# Patient Record
Sex: Female | Born: 1985 | Race: White | Hispanic: No | Marital: Married | Smoking: Never smoker
Health system: Southern US, Community
[De-identification: ages and names within clinical notes are randomized; demographics above are authoritative.]

## PROBLEM LIST (undated history)

## (undated) DIAGNOSIS — R519 Headache, unspecified: Secondary | ICD-10-CM

## (undated) DIAGNOSIS — R51 Headache: Secondary | ICD-10-CM

## (undated) DIAGNOSIS — K529 Noninfective gastroenteritis and colitis, unspecified: Secondary | ICD-10-CM

## (undated) HISTORY — PX: TUBAL LIGATION: SHX77

---

## 2015-04-12 ENCOUNTER — Encounter: Payer: Self-pay | Admitting: Emergency Medicine

## 2015-04-12 ENCOUNTER — Emergency Department
Admission: EM | Admit: 2015-04-12 | Discharge: 2015-04-12 | Disposition: A | Discharge status: Home / Self Care | Payer: Medicaid Other | Attending: Emergency Medicine | Admitting: Emergency Medicine

## 2015-04-12 DIAGNOSIS — N764 Abscess of vulva: Secondary | ICD-10-CM | POA: Diagnosis present

## 2015-04-12 DIAGNOSIS — N750 Cyst of Bartholin's gland: Secondary | ICD-10-CM | POA: Diagnosis not present

## 2015-04-12 MED ORDER — ACETAMINOPHEN-CODEINE #3 300-30 MG PO TABS: 1.0000 | ORAL_TABLET | Freq: Four times a day (QID) | ORAL | Status: DC | PRN

## 2015-04-12 MED ORDER — LIDOCAINE HCL (PF) 1 % IJ SOLN
INTRAMUSCULAR | Status: AC
  Administered 2015-04-12: 18:00:00
  Filled 2015-04-12: qty 5

## 2015-04-12 MED ORDER — CLINDAMYCIN HCL 300 MG PO CAPS: 300.0000 mg | ORAL_CAPSULE | Freq: Four times a day (QID) | ORAL | Status: DC

## 2015-04-12 NOTE — Discharge Instructions (Signed)
Bartholin's Cyst or Abscess °Bartholin's glands are small glands located within the folds of skin (labia) along the sides of the lower opening of the vagina (birth canal). A cyst may develop when the duct of the gland becomes blocked. When this happens, fluid that accumulates within the cyst can become infected. This is known as an abscess. The Bartholin gland produces a mucous fluid to lubricate the outside of the vagina during sexual intercourse. °SYMPTOMS  °· Patients with a small cyst may not have any symptoms. °· Mild discomfort to severe pain depending on the size of the cyst and if it is infected (abscess). °· Pain, redness, and swelling around the lower opening of the vagina. °· Painful intercourse. °· Pressure in the perineal area. °· Swelling of the lips of the vagina (labia). °· The cyst or abscess can be on one side or both sides of the vagina. °DIAGNOSIS  °· A large swelling is seen in the lower vagina area by your caregiver. °· Painful to touch. °· Redness and pain, if it is an abscess. °TREATMENT  °· Sometimes the cyst will go away on its own. °· Apply warm wet compresses to the area or take hot sitz baths several times a day. °· An incision to drain the cyst or abscess with local anesthesia. °· Culture the pus, if it is an abscess. °· Antibiotic treatment, if it is an abscess. °· Cut open the gland and suture the edges to make the opening of the gland bigger (marsupialization). °· Remove the whole gland if the cyst or abscess returns. °PREVENTION  °· Practice good hygiene. °· Clean the vaginal area with a mild soap and soft cloth when bathing. °· Do not rub hard in the vaginal area when bathing. °· Protect the crotch area with a padded cushion if you take long bike rides or ride horses. °· Be sure you are well lubricated when you have sexual intercourse. °HOME CARE INSTRUCTIONS  °· If your cyst or abscess was opened, a small piece of gauze, or a drain, may have been placed in the wound to allow  drainage. Do not remove this gauze or drain unless directed by your caregiver. °· Wear feminine pads, not tampons, as needed for any drainage or bleeding. °· If antibiotics were prescribed, take them exactly as directed. Finish the entire course. °· Only take over-the-counter or prescription medicines for pain, discomfort, or fever as directed by your caregiver. °SEEK IMMEDIATE MEDICAL CARE IF:  °· You have an increase in pain, redness, swelling, or drainage. °· You have bleeding from the wound which results in the use of more than the number of pads suggested by your caregiver in 24 hours. °· You have chills. °· You have a fever. °· You develop any new problems (symptoms) or aggravation of your existing condition. °MAKE SURE YOU:  °· Understand these instructions. °· Will watch your condition. °· Will get help right away if you are not doing well or get worse. °Document Released: 10/10/2005 Document Revised: 01/02/2012 Document Reviewed: 05/28/2008 °[employer]® Patient Information ©2015 [employer]. This information is not intended to replace advice given to you by your health care provider. Make sure you discuss any questions you have with your health care provider. ° ° °Incision and Drainage °Incision and drainage is a procedure in which a sac-like structure (cystic structure) is opened and drained. The area to be drained usually contains material such as pus, fluid, or blood.  °LET YOUR CAREGIVER KNOW ABOUT:  °· Allergies to   Medicines taken, including vitamins, herbs, eyedrops, over-the-counter medicines, and creams.  Use of steroids (by mouth or creams).  Previous problems with anesthetics or numbing medicines.  History of bleeding problems or blood clots.  Previous surgery.  Other health problems, including diabetes and kidney problems.  Possibility of pregnancy, if this applies. RISKS AND COMPLICATIONS  Pain.  Bleeding.  Scarring.  Infection. BEFORE THE PROCEDURE  You may  need to have an ultrasound or other imaging tests to see how large or deep your cystic structure is. Blood tests may also be used to determine if you have an infection or how severe the infection is. You may need to have a tetanus shot. PROCEDURE  The affected area is cleaned with a cleaning fluid. The cyst area will then be numbed with a medicine (local anesthetic). A small incision will be made in the cystic structure. A syringe or catheter may be used to drain the contents of the cystic structure, or the contents may be squeezed out. The area will then be flushed with a cleansing solution. After cleansing the area, it is often gently packed with a gauze or another wound dressing. Once it is packed, it will be covered with gauze and tape or some other type of wound dressing. AFTER THE PROCEDURE   Often, you will be allowed to go home right after the procedure.  You may be given antibiotic medicine to prevent or heal an infection.  If the area was packed with gauze or some other wound dressing, you will likely need to come back in 1 to 2 days to get it removed.  The area should heal in about 14 days. Document Released: 04/05/2001 Document Revised: 04/10/2012 Document Reviewed: 12/05/2011 [facility] Patient Information 2015 [place], [place]. This information is not intended to replace advice given to you by your health care provider. Make sure you discuss any questions you have with your health care provider.  Keep the area clean, dry, and covered. Use epsom salt (magnesium chloride) in your tub soaks to reduce swelling.  Take the antibiotic as directed until completley gone.  Follow-up your provider or the local health department for follow-up care.

## 2015-04-12 NOTE — ED Provider Notes (Signed)
[facility] Emergency Department Provider Note ?____________________________________________ ? Time seen: 1635 ? I have reviewed the triage vital signs and the nursing notes. ________ HISTORY ? Chief Complaint Abscess  HPI  Ariel Rodgers is a 29 y.o. female reports to the ED for evaluation management of a vulvar abscess with onset 2 days prior to arrival. She describes irritation to the vulvar area after an episode of intercourse. It began a tender spot to the inner labia and after a warm bath last night, the area began to spontaneously draining purulent discharge. She gives a history of a similar incident about 5 years ago, which required local incision and drainage. She denies fever, chills, sweats, nausea or dizziness.    History reviewed. No pertinent past medical history.  There are no active problems to display for this patient. ? Past Surgical History  Procedure Laterality Date  . Tubal ligation    ? Current Outpatient Rx  Name  Route  Sig  Dispense  Refill  . acetaminophen-codeine (TYLENOL #3) 300-30 MG per tablet   Oral   Take 1 tablet by mouth every 6 (six) hours as needed for moderate pain.   10 tablet   0   . clindamycin (CLEOCIN) 300 MG capsule   Oral   Take 1 capsule (300 mg total) by mouth 4 (four) times daily.   40 capsule   0   ? Allergies Review of patient's allergies indicates no known allergies. ? No family history on file. ? Social History History  Substance Use Topics  . Smoking status: Never Smoker   . Smokeless tobacco: Not on file  . Alcohol Use: Yes   Review of Systems Constitutional: Negative for fever. HEENT:  Normocephalic/atraumatic. Negative for visual/hearingchanges, sore throat, or nasal congestion. Cardiovascular: Negative for chest pain. Respiratory: Negative for shortness of breath. Musculoskeletal: Negative for back pain. Skin: Tender, draining abscess to the labia.  Neurological: Negative for  headaches, focal weakness or numbness. Hematological/Lymphatic:Negative for enlarged lymph nodes  10-point ROS otherwise negative. ____________________________________________  PHYSICAL EXAM:  VITAL SIGNS: ED Triage Vitals  Enc Vitals Group     BP 04/12/15 1556 116/81 mmHg     Pulse Rate 04/12/15 1556 80     Resp --      Temp 04/12/15 1556 99 F (37.2 C)     Temp Source 04/12/15 1556 Oral     SpO2 04/12/15 1556 100 %     Weight 04/12/15 1551 145 lb (65.772 kg)     Height 04/12/15 1551  (1.626 m)     Head Cir --      Peak Flow --      Pain Score 04/12/15 1551 7     Pain Loc --      Pain Edu? --      Excl. in GC? --    Constitutional: Alert and oriented. Well appearing and in no distress. HEENT: Normocephalic and atraumatic.Conjunctivae are normal. PERRL. Normal extraocular movements. Mucous membranes are moist. Hematological/Lymphatic/Immunilogical: No cervical lymphadenopathy. Cardiovascular: Normal rate, regular rhythm.No murmurs, rubs, or gallops.  Respiratory: Normal respiratory effort.  Genitourinary: Left bartholin gland cyst & abscess with spontaneous purulent drainage from the gland duct.  Musculoskeletal: Nontender with normal range of motion in all extremities. No joint effusions.  No lower extremity tenderness nor edema. Neurologic:  Normal speech and language. No gross focal neurologic deficits are appreciated.  Skin:  No rash noted. Psychiatric: Mood and affect are normal. Speech and behavior are normal. Patient exhibits appropriate  insight and judgment. _____________ PROCEDURES  INCISION AND DRAINAGE Performed by: Lissa Hoard Consent: Verbal consent obtained. Risks and benefits: risks, benefits and alternatives were discussed Type: abscess  Body area: vulva  Anesthesia: local infiltration  Incision was made with a scalpel.  Local anesthetic: lidocaine 1% w/o epinephrine  Anesthetic total: 3 ml  Complexity: simple Blunt dissection  to break up loculations  Drainage: purulent  Drainage amount: 3 ml  Patient tolerance: Patient tolerated the procedure well with no immediate complications. ______________________________________________________ INITIAL IMPRESSION / ASSESSMENT AND PLAN / ED COURSE ? Simple, spontaneously draining vulvar absces s/p incision & drainage. Referral to [facility] Department for follow-up management.  Return as needed for wound check. Warm compresses and sitz bath for cyst care. Prescription Clindamycin and Tylenol #3 for pain relief. ____________________________________________ FINAL CLINICAL IMPRESSION(S) / ED DIAGNOSES?  Final diagnoses:  Vulvar abscess  Bartholin's gland cyst      Lissa Hoard, PA-C 04/12/15 2303  Emily Filbert, MD 04/12/15 2308

## 2015-04-12 NOTE — ED Notes (Signed)
Patient presents to the ED with an abscess to her perineal area.  Patient states abscess is draining puss.  Patient ambulatory to triage.  No obvious distress at this time.

## 2017-08-07 ENCOUNTER — Emergency Department: Payer: Medicaid Other

## 2017-08-07 ENCOUNTER — Encounter: Payer: Self-pay | Admitting: *Deleted

## 2017-08-07 ENCOUNTER — Emergency Department
Admission: EM | Admit: 2017-08-07 | Discharge: 2017-08-07 | Disposition: A | Discharge status: Home / Self Care | Payer: Medicaid Other | Attending: Emergency Medicine | Admitting: Emergency Medicine

## 2017-08-07 DIAGNOSIS — R1030 Lower abdominal pain, unspecified: Secondary | ICD-10-CM | POA: Diagnosis present

## 2017-08-07 DIAGNOSIS — K529 Noninfective gastroenteritis and colitis, unspecified: Secondary | ICD-10-CM | POA: Insufficient documentation

## 2017-08-07 HISTORY — DX: Noninfective gastroenteritis and colitis, unspecified: K52.9

## 2017-08-07 LAB — URINALYSIS, COMPLETE (UACMP) WITH MICROSCOPIC
BILIRUBIN URINE: NEGATIVE
Bacteria, UA: NONE SEEN
Glucose, UA: NEGATIVE mg/dL
Ketones, ur: 5 mg/dL — AB
LEUKOCYTES UA: NEGATIVE
Nitrite: NEGATIVE
Protein, ur: NEGATIVE mg/dL
SPECIFIC GRAVITY, URINE: 1.028 (ref 1.005–1.030)
pH: 5 (ref 5.0–8.0)

## 2017-08-07 LAB — COMPREHENSIVE METABOLIC PANEL
ALBUMIN: 4.8 g/dL (ref 3.5–5.0)
ALT: 17 U/L (ref 14–54)
ANION GAP: 7 (ref 5–15)
AST: 20 U/L (ref 15–41)
Alkaline Phosphatase: 66 U/L (ref 38–126)
BUN: 13 mg/dL (ref 6–20)
CO2: 27 mmol/L (ref 22–32)
Calcium: 9.3 mg/dL (ref 8.9–10.3)
Chloride: 105 mmol/L (ref 101–111)
Creatinine, Ser: 0.65 mg/dL (ref 0.44–1.00)
GFR calc non Af Amer: >60 mL/min (ref >60)
GLUCOSE: 105 mg/dL — AB (ref 65–99)
Potassium: 3.9 mmol/L (ref 3.5–5.1)
Sodium: 139 mmol/L (ref 135–145)
Total Bilirubin: 0.6 mg/dL (ref 0.3–1.2)
Total Protein: 8 g/dL (ref 6.5–8.1)

## 2017-08-07 LAB — CBC
HCT: 42.8 % (ref 35.0–47.0)
Hemoglobin: 14.4 g/dL (ref 12.0–16.0)
MCH: 29.3 pg (ref 26.0–34.0)
MCHC: 33.5 g/dL (ref 32.0–36.0)
MCV: 87.3 fL (ref 80.0–100.0)
Platelets: 239 10*3/uL (ref 150–440)
RBC: 4.91 MIL/uL (ref 3.80–5.20)
RDW: 13.2 % (ref 11.5–14.5)
WBC: 12.2 10*3/uL — ABNORMAL HIGH (ref 3.6–11.0)

## 2017-08-07 LAB — LIPASE, BLOOD: LIPASE: 22 U/L (ref 11–51)

## 2017-08-07 LAB — PREGNANCY, URINE: Preg Test, Ur: NEGATIVE

## 2017-08-07 MED ORDER — CIPROFLOXACIN HCL 500 MG PO TABS: 500.0000 mg | ORAL_TABLET | Freq: Two times a day (BID) | ORAL | 0 refills | Status: AC

## 2017-08-07 MED ORDER — METRONIDAZOLE 500 MG PO TABS: 500.0000 mg | ORAL_TABLET | Freq: Three times a day (TID) | ORAL | 0 refills | Status: AC

## 2017-08-07 MED ORDER — IOPAMIDOL (ISOVUE-300) INJECTION 61%
15.0000 mL | INTRAVENOUS | Status: AC
  Administered 2017-08-07: 15 mL via ORAL
  Filled 2017-08-07 (×2): qty 15

## 2017-08-07 MED ORDER — METRONIDAZOLE 500 MG PO TABS
500.0000 mg | ORAL_TABLET | Freq: Once | ORAL | Status: AC
  Administered 2017-08-07: 500 mg via ORAL
  Filled 2017-08-07: qty 1

## 2017-08-07 MED ORDER — CIPROFLOXACIN HCL 500 MG PO TABS
500.0000 mg | ORAL_TABLET | Freq: Once | ORAL | Status: AC
  Administered 2017-08-07: 500 mg via ORAL
  Filled 2017-08-07: qty 1

## 2017-08-07 MED ORDER — IOPAMIDOL (ISOVUE-300) INJECTION 61%
100.0000 mL | Freq: Once | INTRAVENOUS | Status: AC | PRN
  Administered 2017-08-07: 100 mL via INTRAVENOUS
  Filled 2017-08-07: qty 100

## 2017-08-07 MED ORDER — SODIUM CHLORIDE 0.9 % IV BOLUS (SEPSIS)
1000.0000 mL | Freq: Once | INTRAVENOUS | Status: AC
  Administered 2017-08-07: 1000 mL via INTRAVENOUS

## 2017-08-07 NOTE — ED Triage Notes (Addendum)
Pt to ED reporting abd pain that began this morning. ,PT reports constipation and nausea. No fever reported at home. No changes in urine. Pt has hx of colitis. Pt also reports being late on her period but also has a hx of tubal ligation. Pt reports pain increases when she is attempting to have a BM.

## 2017-08-07 NOTE — ED Notes (Signed)
RN called lab to update on urine preg add-on.

## 2017-08-07 NOTE — ED Notes (Signed)
Pt discharged home after verbalizing understanding of discharge instructions; nad noted. 

## 2017-08-07 NOTE — ED Notes (Signed)
PT CAN NOT HAVE PAIN MEDICATION! PER PT REQUEST

## 2017-08-07 NOTE — ED Provider Notes (Signed)
[facility] Emergency Department Provider Note  ____________________________________________   First MD Initiated Contact with Patient 08/07/17 1402     (approximate)  I have reviewed the triage vital signs and the nursing notes.   HISTORY  Chief Complaint Abdominal Pain    HPI Ariel Rodgers is a 31 y.o. female history of C. difficile colitis years in the past was presenting to the emergency department with cramping and sharp abdominal pain across her lower abdomen. She says that she had not had a bowel movement in 2 days and has had 2 bowel movements this morning after taking a laxative. She says that her pain was severe prior to arrival and after having 2 bowel movements is now minimal but still aching to the lower abdomen. She denies any radiation of the back. Denies any nausea or vomiting. Denies any burning with urination, vaginal bleeding or discharge. Says that she has recently changed her diet and is eating more sallowness as well as fiber products but has not been drinking as much water as she thinks that she should. Patient has a history of a tubal ligation. Also says that she is late on her period.   Past Medical History:  Diagnosis Date  . Colitis     There are no active problems to display for this patient.   Past Surgical History:  Procedure Laterality Date  . TUBAL LIGATION      Prior to Admission medications   Medication Sig Start Date End Date Taking? Authorizing Provider  acetaminophen-codeine (TYLENOL #3) 300-30 MG per tablet Take 1 tablet by mouth every 6 (six) hours as needed for moderate pain. 04/12/15   Menshew, Charlesetta Ivory, PA-C  clindamycin (CLEOCIN) 300 MG capsule Take 1 capsule (300 mg total) by mouth 4 (four) times daily. 04/12/15   Menshew, Charlesetta Ivory, PA-C    Allergies Patient has no known allergies.  History reviewed. No pertinent family history.  Social History Social History  Substance Use Topics  .  Smoking status: Never Smoker  . Smokeless tobacco: Never Used  . Alcohol use Yes    Review of Systems  Constitutional: No fever/chills Eyes: No visual changes. ENT: No sore throat. Cardiovascular: Denies chest pain. Respiratory: Denies shortness of breath. Gastrointestinal:  No nausea, no vomiting.  No diarrhea.   Genitourinary: Negative for dysuria. Musculoskeletal: Negative for back pain. Skin: Negative for rash. Neurological: Negative for headaches, focal weakness or numbness.   ____________________________________________   PHYSICAL EXAM:  VITAL SIGNS: ED Triage Vitals  Enc Vitals Group     BP 08/07/17 1201 123/68     Pulse Rate 08/07/17 1201 95     Resp 08/07/17 1201 16     Temp 08/07/17 1201 99 F (37.2 C)     Temp Source 08/07/17 1201 Oral     SpO2 08/07/17 1201 100 %     Weight 08/07/17 1201 155 lb (70.3 kg)     Height 08/07/17 1201  (1.626 m)     Head Circumference --      Peak Flow --      Pain Score 08/07/17 1205 3     Pain Loc --      Pain Edu? --      Excl. in GC? --     Constitutional: Alert and oriented. Well appearing and in no acute distress. Eyes: Conjunctivae are normal.  Head: Atraumatic. Nose: No congestion/rhinnorhea. Mouth/Throat: Mucous membranes are moist.  Neck: No stridor.   Cardiovascular: Normal rate,  regular rhythm. Grossly normal heart sounds.  Respiratory: Normal respiratory effort.  No retractions. Lungs CTAB. Gastrointestinal: Soft with minimal tenderness across the lower abdomen without any rebound or guarding. No upper abdominal tenderness to palpation. no distention. No CVA tenderness. Musculoskeletal: No lower extremity tenderness nor edema.  No joint effusions. Neurologic:  Normal speech and language. No gross focal neurologic deficits are appreciated. Skin:  Skin is warm, dry and intact. No rash noted. Psychiatric: Mood and affect are normal. Speech and behavior are  normal.  ____________________________________________   LABS (all labs ordered are listed, but only abnormal results are displayed)  Labs Reviewed  COMPREHENSIVE METABOLIC PANEL - Abnormal; Notable for the following:       Result Value   Glucose, Bld 105 (*)    All other components within normal limits  CBC - Abnormal; Notable for the following:    WBC 12.2 (*)    All other components within normal limits  URINALYSIS, COMPLETE (UACMP) WITH MICROSCOPIC - Abnormal; Notable for the following:    Color, Urine AMBER (*)    APPearance CLEAR (*)    Hgb urine dipstick MODERATE (*)    Ketones, ur 5 (*)    Squamous Epithelial / LPF 0-5 (*)    All other components within normal limits  LIPASE, BLOOD  PREGNANCY, URINE   ____________________________________________  EKG   ____________________________________________  RADIOLOGY  mild areas of colon wall thickening/colitis which can be seen with infectious or inflammatory bowel disease. ____________________________________________   PROCEDURES  Procedure(s) performed:   Procedures  Critical Care performed:   ____________________________________________   INITIAL IMPRESSION / ASSESSMENT AND PLAN / ED COURSE  Pertinent labs & imaging results that were available during my care of the patient were reviewed by me and considered in my medical decision making (see chart for details).  Differential diagnosis includes, but is not limited to, ovarian cyst, ovarian torsion, acute appendicitis, diverticulitis, urinary tract infection/pyelonephritis, endometriosis, bowel obstruction, colitis, renal colic, gastroenteritis, hernia, pregnancy related pain including ectopic pregnancy, etc.  As part of my medical decision making, I reviewed the following data within the electronic MEDICAL RECORD NUMBER Old chart reviewed  ----------------------------------------- 5:25 PM on 08/07/2017 -----------------------------------------  Patient without  any further complaints. CT showing what appears to be colitis. The patient will be discharged than about it. I recommended gastrology follow-up as this is the second episode of colitis that she has had in her lifetime. Patient will likely require colonoscopy for further investigation. She is understanding of the plan and willing to comply. Will be discharged home at this time.      ____________________________________________   FINAL CLINICAL IMPRESSION(S) / ED DIAGNOSES  colitis    NEW MEDICATIONS STARTED DURING THIS VISIT:  New Prescriptions   No medications on file     Note:  This document was prepared using Dragon voice recognition software and may include unintentional dictation errors.     Myrna Blazer, MD 08/07/17 1725

## 2017-08-28 ENCOUNTER — Ambulatory Visit: Payer: Medicaid Other | Admitting: Gastroenterology

## 2017-08-28 ENCOUNTER — Encounter: Payer: Self-pay | Admitting: Gastroenterology

## 2017-08-28 ENCOUNTER — Other Ambulatory Visit: Payer: Self-pay

## 2017-08-28 VITALS — BP 121/73 | HR 64 | Temp 98.2°F | Ht 64.0 in | Wt 156.4 lb

## 2017-08-28 DIAGNOSIS — K5909 Other constipation: Secondary | ICD-10-CM

## 2017-08-28 DIAGNOSIS — R935 Abnormal findings on diagnostic imaging of other abdominal regions, including retroperitoneum: Secondary | ICD-10-CM

## 2017-08-28 NOTE — Progress Notes (Signed)
Ariel Darby, MD [address]  [place]  [place], [place] [address]  Main: [phone]  Fax: [phone]    Gastroenterology Consultation  Referring Provider:    ER Primary Care Physician: None Primary Gastroenterologist:  Dr. Cephas Rodgers Reason for Consultation:     Chronic constipation        HPI:   Ariel Rodgers is a 31 y.o. female referred from ER  for consultation & management of chronic constipation and abnormal CT. She had history of Clostridium difficile infection several years ago. Has been suffering from constipation for several years now, associated with significant straining, she said she cries whenever she has a BM. She denies any rectal bleeding but reports feeling bloated. She went to ER on 08/07/2017 due to worsening abdominal pain and constipation. She had a CT at that time which revealed nonspecific thickening of the colon. She was discharged home on 10 days course of Cipro and Flagyl which has resulted in diarrhea associated with leakage. She is done with antibiotics about 3 days ago, and her stools are becoming more formed. She hasn't taken any stool softeners. She reports perianal rash and burning whenever she wipes due to loose bowel movements. She denies any other GI symptoms. She is on Suboxone.  NSAIDs: None  Antiplts/Anticoagulants/Anti thrombotics: None  GI Procedures: None She denies having any GI surgeries or other abdominal surgeries other than tubal ligation She denies having any known GI malignancies in the family  She works from home, has 3 children, oldest is 1 years old, married, lives with her husband  Past Medical History:  Diagnosis Date  . Colitis     Past Surgical History:  Procedure Laterality Date  . TUBAL LIGATION      Prior to Admission medications   Medication Sig Start Date End Date Taking? Authorizing Provider  acetaminophen-codeine (TYLENOL #3) 300-30 MG per tablet Take 1 tablet by mouth every 6 (six) hours as  needed for moderate pain. 04/12/15   Menshew, Dannielle Karvonen, PA-C  clindamycin (CLEOCIN) 300 MG capsule Take 1 capsule (300 mg total) by mouth 4 (four) times daily. 04/12/15   Menshew, Dannielle Karvonen, PA-C  SUBOXONE 8-2 MG FILM PLACE 1 FILM UNDER TONGURE 2 TIMES A DAY 08/11/17   [provider]    No family history on file.   Social History   Tobacco Use  . Smoking status: Never Smoker  . Smokeless tobacco: Never Used  Substance Use Topics  . Alcohol use: Yes  . Drug use: Not on file    Allergies as of 08/28/2017  . (No Known Allergies)    Review of Systems:    All systems reviewed and negative except where noted in HPI.   Physical Exam:  There were no vitals taken for this visit. No LMP recorded.  General:   Alert,  Well-developed, well-nourished, pleasant and cooperative in NAD Head:  Normocephalic and atraumatic. Eyes:  Sclera clear, no icterus.   Conjunctiva pink. Ears:  Normal auditory acuity. Nose:  No deformity, discharge, or lesions. Mouth:  No deformity or lesions,oropharynx pink & moist. Neck:  Supple; no masses or thyromegaly. Lungs:  Respirations even and unlabored.  Clear throughout to auscultation.   No wheezes, crackles, or rhonchi. No acute distress. Heart:  Regular rate and rhythm; no murmurs, clicks, rubs, or gallops. Abdomen:  Normal bowel sounds. Soft, non-tender and non-distended without masses, hepatosplenomegaly or hernias noted.  No guarding or rebound tenderness.   Rectal: Nor performed,  mild erythema in the perianal area but no skin breakdown Msk:  Symmetrical without gross deformities. Good, equal movement & strength bilaterally. Pulses:  Normal pulses noted. Extremities:  No clubbing or edema.  No cyanosis. Neurologic:  Alert and oriented x3;  grossly normal neurologically. Skin:  Intact without significant lesions or rashes. No jaundice. Lymph Nodes:  No significant cervical adenopathy. Psych:  Alert and cooperative. Normal mood and  affect.  Imaging Studies: Reviewed  Assessment and Plan:   Ariel Rodgers is a 31 y.o. female with remote history of Clostridium difficile infection reported by patient, chronic constipation with recent ER visit due to worsening of symptoms, CT A/P revealed nonspecific thickening of the colon. Currently, she is not suffering from constipation  - Check ESR, CRP, fecal calProtectin, TSH - Schedule colonoscopy  I have discussed alternative options, risks & benefits,  which include, but are not limited to, bleeding, infection, perforation,respiratory complication & drug reaction.  The patient agrees with this plan & written consent will be obtained.     Follow up in 3 months   Ariel Darby, MD

## 2017-08-30 ENCOUNTER — Other Ambulatory Visit: Payer: Self-pay

## 2017-08-30 DIAGNOSIS — K5909 Other constipation: Secondary | ICD-10-CM

## 2017-08-30 DIAGNOSIS — R935 Abnormal findings on diagnostic imaging of other abdominal regions, including retroperitoneum: Secondary | ICD-10-CM

## 2017-08-31 ENCOUNTER — Other Ambulatory Visit
Admission: RE | Admit: 2017-08-31 | Discharge: 2017-08-31 | Disposition: A | Discharge status: Home / Self Care | Payer: Medicaid Other | Source: Ambulatory Visit | Attending: Gastroenterology | Admitting: Gastroenterology

## 2017-08-31 DIAGNOSIS — R935 Abnormal findings on diagnostic imaging of other abdominal regions, including retroperitoneum: Secondary | ICD-10-CM | POA: Diagnosis not present

## 2017-08-31 LAB — TSH: TSH: 1.701 u[IU]/mL (ref 0.350–4.500)

## 2017-08-31 LAB — C-REACTIVE PROTEIN: CRP: <0.8 mg/dL (ref <1.0)

## 2017-08-31 LAB — SEDIMENTATION RATE: SED RATE: 14 mm/h (ref 0–20)

## 2017-09-01 ENCOUNTER — Other Ambulatory Visit
Admission: RE | Admit: 2017-09-01 | Discharge: 2017-09-01 | Disposition: A | Discharge status: Home / Self Care | Payer: Medicaid Other | Source: Ambulatory Visit | Attending: Gastroenterology | Admitting: Gastroenterology

## 2017-09-01 ENCOUNTER — Telehealth: Payer: Self-pay

## 2017-09-01 NOTE — Telephone Encounter (Signed)
Pt has been informed her blood work is normal.  She said she will drop her stool off today she just forgot.  Thanks [employer]le

## 2017-09-01 NOTE — Telephone Encounter (Signed)
-----   Message from Toney Reilohini Reddy Vanga, MD sent at 09/01/2017  1:19 PM EST ----- Notify pt of the normal blood work. She needs to drop off stool sample for fecal calprotectin test  -RV

## 2017-09-07 LAB — CALPROTECTIN, FECAL: Calprotectin, Fecal: 59 ug/g (ref 0–120)

## 2017-09-08 ENCOUNTER — Telehealth: Payer: Self-pay

## 2017-09-08 NOTE — Telephone Encounter (Signed)
-----   Message from Toney Reilohini Reddy Vanga, MD sent at 09/07/2017  5:12 PM EST ----- Notify pt of the result as normal  -RV

## 2017-09-08 NOTE — Telephone Encounter (Signed)
Pt has been informed labs are normal.  Thanks Marcelino DusterMichelle

## 2017-09-21 ENCOUNTER — Encounter: Admission: RE | Payer: Self-pay | Source: Ambulatory Visit

## 2017-09-21 ENCOUNTER — Other Ambulatory Visit: Payer: Self-pay

## 2017-09-21 ENCOUNTER — Ambulatory Visit: Admission: RE | Admit: 2017-09-21 | Payer: Medicaid Other | Source: Ambulatory Visit | Admitting: Gastroenterology

## 2017-09-21 DIAGNOSIS — R935 Abnormal findings on diagnostic imaging of other abdominal regions, including retroperitoneum: Secondary | ICD-10-CM

## 2017-09-21 SURGERY — COLONOSCOPY WITH PROPOFOL
Anesthesia: General

## 2017-09-25 ENCOUNTER — Encounter: Payer: Self-pay | Admitting: *Deleted

## 2017-09-25 ENCOUNTER — Other Ambulatory Visit: Payer: Self-pay

## 2017-09-26 ENCOUNTER — Encounter: Payer: Self-pay | Admitting: Anesthesiology

## 2017-09-26 NOTE — Discharge Instructions (Signed)
General Anesthesia, Adult, Care After °These instructions provide you with information about caring for yourself after your procedure. Your health care provider may also give you more specific instructions. Your treatment has been planned according to current medical practices, but problems sometimes occur. Call your health care provider if you have any problems or questions after your procedure. °What can I expect after the procedure? °After the procedure, it is common to have: °· Vomiting. °· A sore throat. °· Mental slowness. ° °It is common to feel: °· Nauseous. °· Cold or shivery. °· Sleepy. °· Tired. °· Sore or achy, even in parts of your body where you did not have surgery. ° °Follow these instructions at home: °For at least 24 hours after the procedure: °· Do not: °? Participate in activities where you could fall or become injured. °? Drive. °? Use heavy machinery. °? Drink alcohol. °? Take sleeping pills or medicines that cause drowsiness. °? Make important decisions or sign legal documents. °? Take care of children on your own. °· Rest. °Eating and drinking °· If you vomit, drink water, juice, or soup when you can drink without vomiting. °· Drink enough fluid to keep your urine clear or pale yellow. °· Make sure you have little or no nausea before eating solid foods. °· Follow the diet recommended by your health care provider. °General instructions °· Have a responsible adult stay with you until you are awake and alert. °· Return to your normal activities as told by your health care provider. Ask your health care provider what activities are safe for you. °· Take over-the-counter and prescription medicines only as told by your health care provider. °· If you smoke, do not smoke without supervision. °· Keep all follow-up visits as told by your health care provider. This is important. °Contact a health care provider if: °· You continue to have nausea or vomiting at home, and medicines are not helpful. °· You  cannot drink fluids or start eating again. °· You cannot urinate after 8-12 hours. °· You develop a skin rash. °· You have fever. °· You have increasing redness at the site of your procedure. °Get help right away if: °· You have difficulty breathing. °· You have chest pain. °· You have unexpected bleeding. °· You feel that you are having a life-threatening or urgent problem. °This information is not intended to replace advice given to you by your health care provider. Make sure you discuss any questions you have with your health care provider. °Document Released: 01/16/2001 Document Revised: 03/14/2016 Document Reviewed: 09/24/2015 °Elsevier Interactive Patient Education © 2018 [employer] ° °

## 2017-09-27 ENCOUNTER — Ambulatory Visit: Admission: RE | Admit: 2017-09-27 | Payer: Medicaid Other | Source: Ambulatory Visit | Admitting: Gastroenterology

## 2017-09-27 HISTORY — DX: Headache: R51

## 2017-09-27 HISTORY — DX: Headache, unspecified: R51.9

## 2017-09-27 SURGERY — COLONOSCOPY WITH PROPOFOL
Anesthesia: Choice

## 2019-06-08 IMAGING — CT CT ABD-PELV W/ CM
2 of 4 series · 16 of 46 positions shown, 18 images · IV contrast (APPLIED)
Comparison: None.

CLINICAL DATA: Constipation nausea and abdominal pain

EXAM:
CT ABDOMEN AND PELVIS WITH CONTRAST
TECHNIQUE: Multidetector CT imaging of the abdomen and pelvis was performed
using the standard protocol following bolus administration of
intravenous contrast.
CONTRAST:  100mL [id number]-LOO IOPAMIDOL (Y9VE4V-LOO) INJECTION 61%

[Series 2: routine abd/pel with · axial · 0.76mm/px · z∈[-479,-99]mm · 13 of 84 slices shown, 15 images]
[im 4/84  soft-tissue]
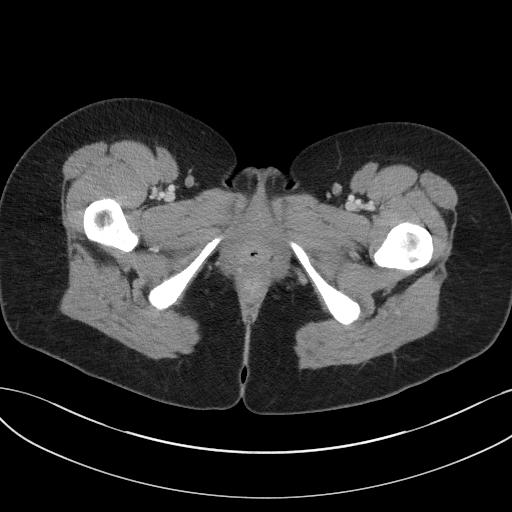
[im 4/84  bone]
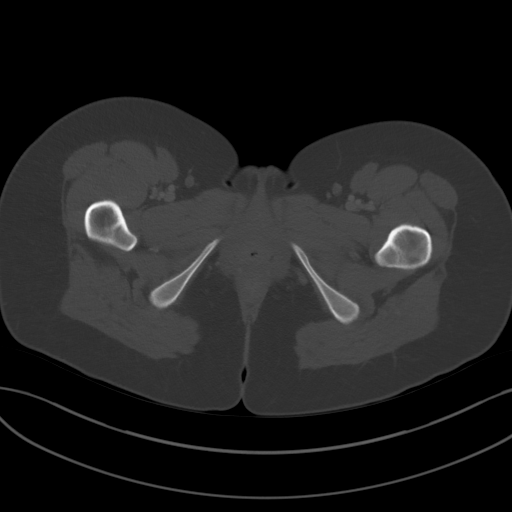
[im 10/84  soft-tissue]
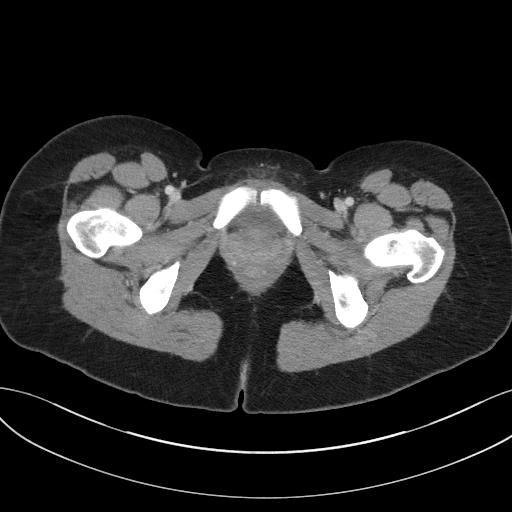
[im 17/84  soft-tissue]
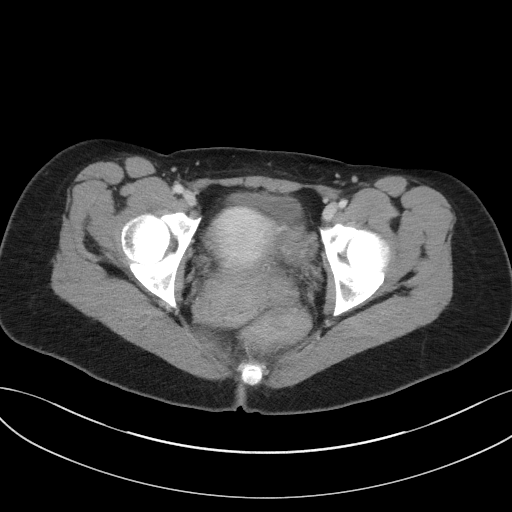
[im 24/84  soft-tissue]
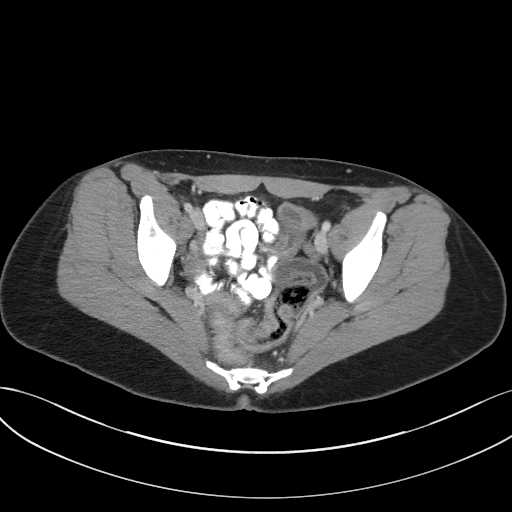
[im 30/84  soft-tissue]
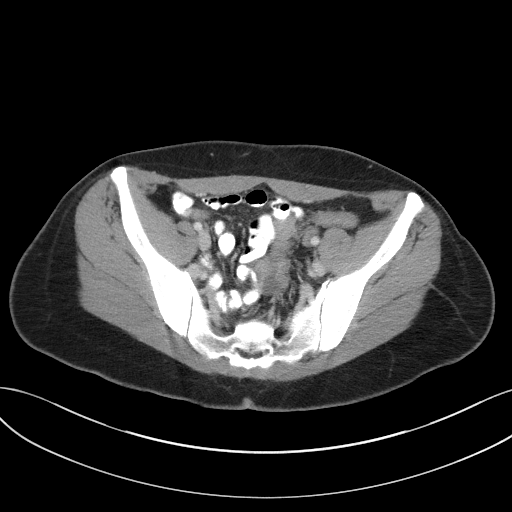
[im 37/84  soft-tissue]
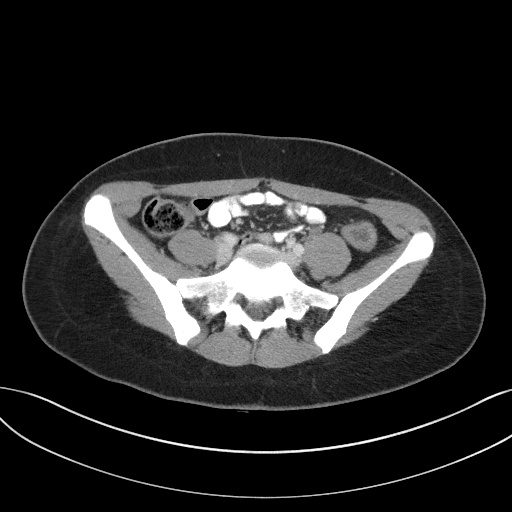
[im 44/84  soft-tissue]
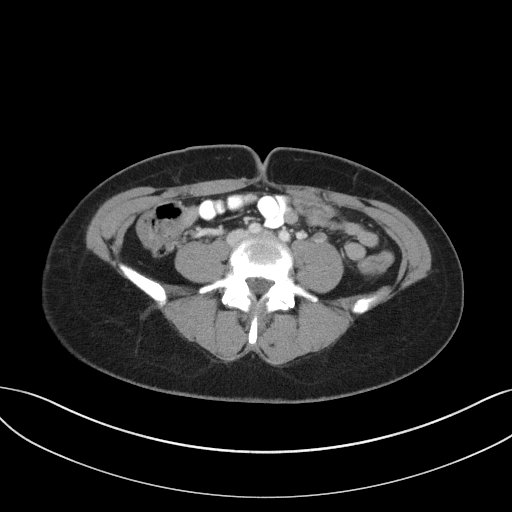
[im 47/84  soft-tissue]
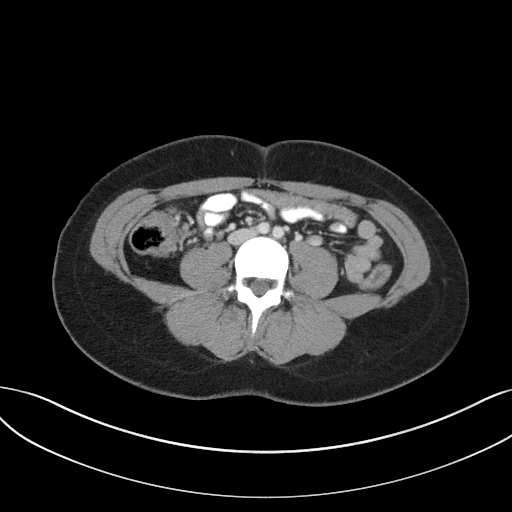
[im 54/84  soft-tissue]
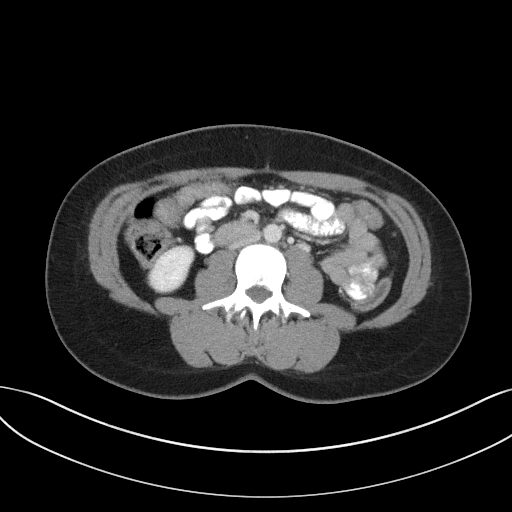
[im 54/84  bone]
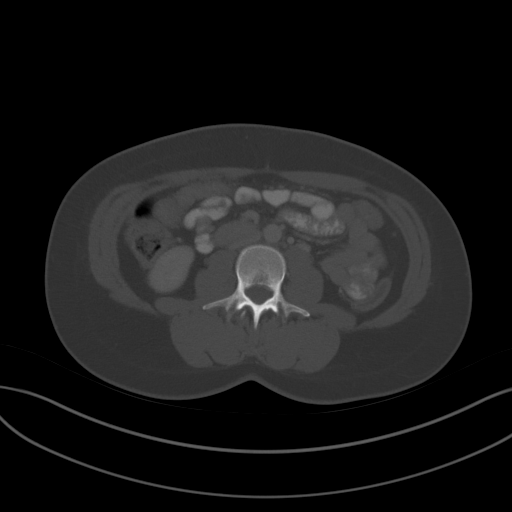
[im 60/84  soft-tissue]
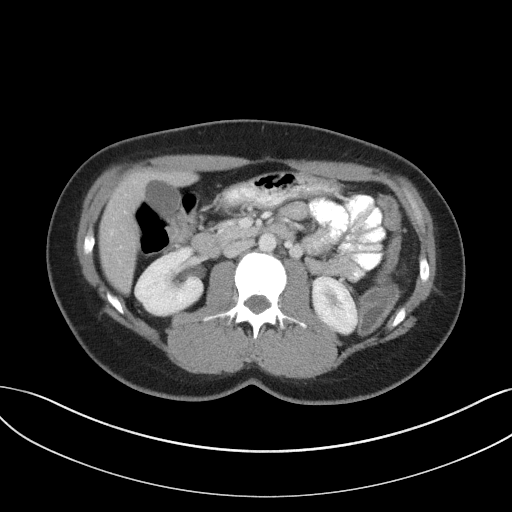
[im 67/84  soft-tissue]
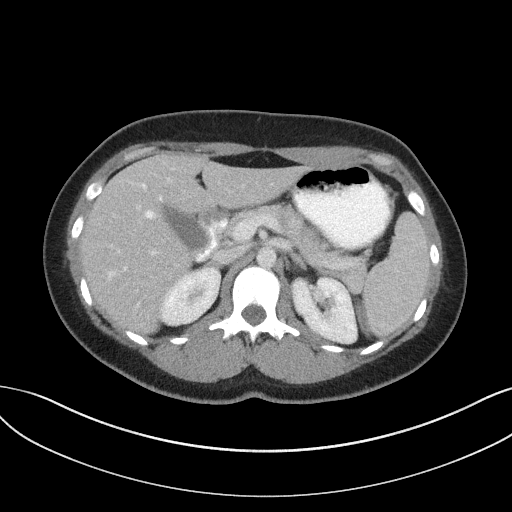
[im 74/84  soft-tissue]
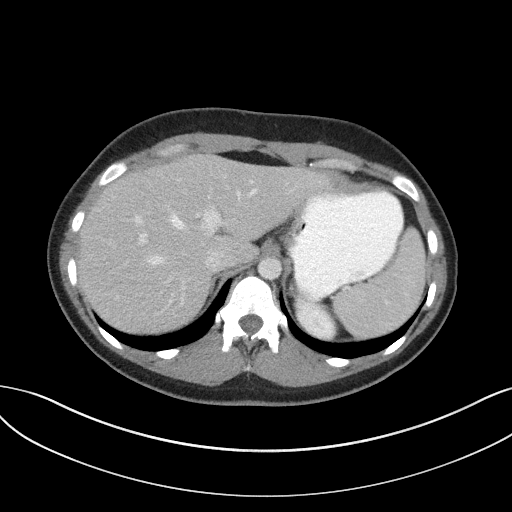
[im 80/84  soft-tissue]
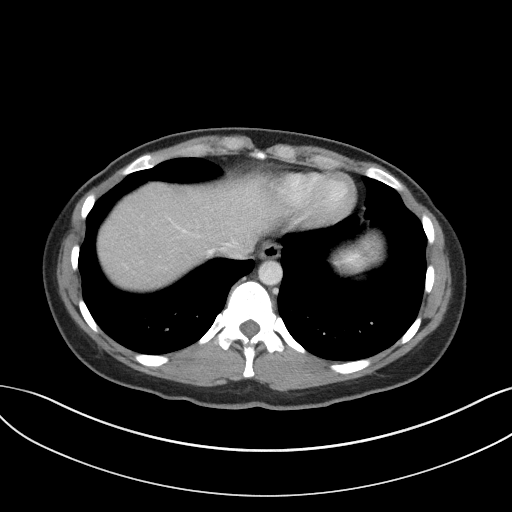

[Series 5: coronal st · coronal · 0.63mm/px · 3 of 75 slices shown]
[im 25/75  soft-tissue]
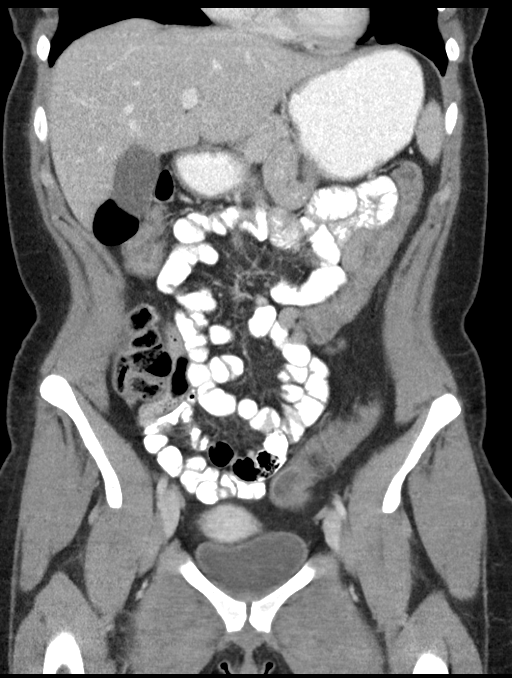
[im 33/75  soft-tissue]
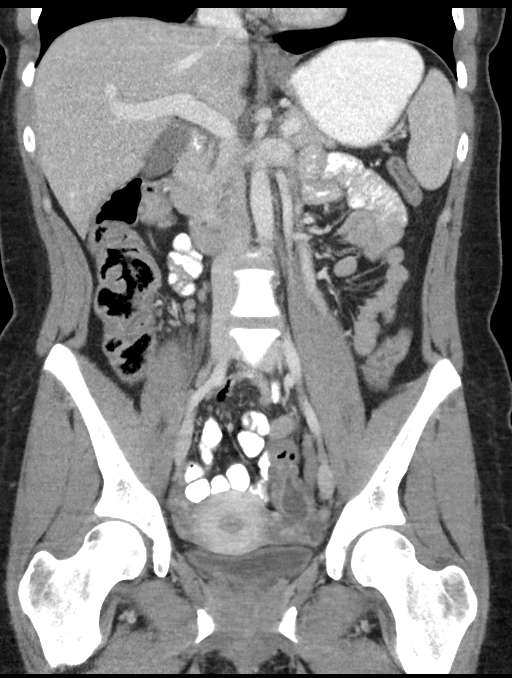
[im 42/75  soft-tissue]
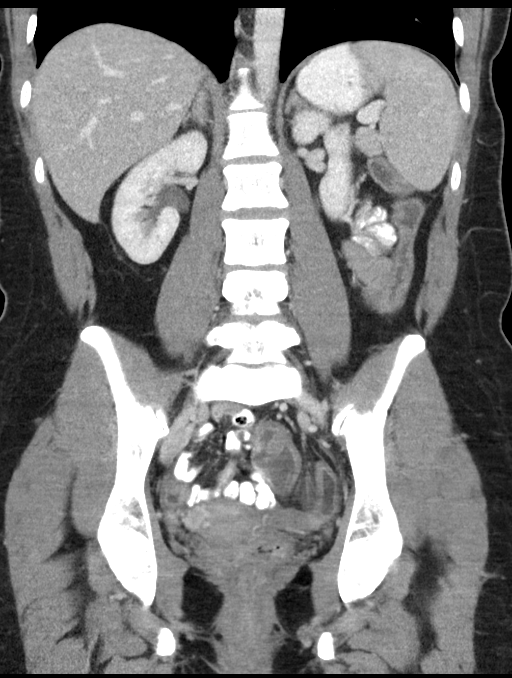

[16 of 46 positions shown; findings below may reference images not displayed]

FINDINGS: Lower chest: No acute abnormality.

Hepatobiliary: No focal liver abnormality is seen. No gallstones,
gallbladder wall thickening, or biliary dilatation.

Pancreas: Unremarkable. No pancreatic ductal dilatation or
surrounding inflammatory changes.

Spleen: Normal in size without focal abnormality.

Adrenals/Urinary Tract: Adrenal glands are unremarkable. Kidneys are
normal, without renal calculi, focal lesion, or hydronephrosis.
Bladder is unremarkable.

Stomach/Bowel: Stomach is nonenlarged. No dilated small bowel.
Suspected areas of mild colon wall thickening and mucosal
enhancement at the ascending colon, descending colon, and
rectosigmoid colon. Short but normal appearing appendix.

Vascular/Lymphatic: No significant vascular findings are present. No
enlarged abdominal or pelvic lymph nodes.

Reproductive: Uterus and bilateral adnexa are unremarkable.

Other: Small amount of free fluid in the pelvis. Negative for free
air

Musculoskeletal: No acute or significant osseous findings.
IMPRESSION: 1. Suspected mild areas of colon wall thickening/colitis as may be
seen with infectious or inflammatory bowel disease. Negative for
obstruction.
2. Small amount of free fluid in the pelvis.

## 2020-05-16 ENCOUNTER — Other Ambulatory Visit: Payer: Self-pay

## 2020-05-16 ENCOUNTER — Ambulatory Visit (INDEPENDENT_AMBULATORY_CARE_PROVIDER_SITE_OTHER): Payer: Medicaid Other

## 2020-05-16 ENCOUNTER — Ambulatory Visit (HOSPITAL_COMMUNITY)
Admission: EM | Admit: 2020-05-16 | Discharge: 2020-05-16 | Disposition: A | Discharge status: Home / Self Care | Payer: Medicaid Other | Attending: Emergency Medicine | Admitting: Emergency Medicine

## 2020-05-16 ENCOUNTER — Encounter (HOSPITAL_COMMUNITY): Payer: Self-pay | Admitting: Emergency Medicine

## 2020-05-16 DIAGNOSIS — S62630A Displaced fracture of distal phalanx of right index finger, initial encounter for closed fracture: Secondary | ICD-10-CM

## 2020-05-16 DIAGNOSIS — M7989 Other specified soft tissue disorders: Secondary | ICD-10-CM | POA: Diagnosis not present

## 2020-05-16 DIAGNOSIS — M79644 Pain in right finger(s): Secondary | ICD-10-CM | POA: Diagnosis not present

## 2020-05-16 MED ORDER — IBUPROFEN 800 MG PO TABS: 800.0000 mg | ORAL_TABLET | Freq: Once | ORAL | Status: DC

## 2020-05-16 MED ORDER — IBUPROFEN 800 MG PO TABS: 800.0000 mg | ORAL_TABLET | Freq: Three times a day (TID) | ORAL | 0 refills | Status: DC

## 2020-05-16 NOTE — Discharge Instructions (Signed)
Continue to wear splint Follow up with hand/ortho for further  treatment Use anti-inflammatories for pain/swelling. You may take up to 800 mg Ibuprofen every 8 hours with food. You may supplement Ibuprofen with Tylenol [phone] mg every 8 hours.  Ice

## 2020-05-16 NOTE — ED Triage Notes (Signed)
Pt c/o right index finger injury after bumping her hand on her husbands head. She states it swole up immediately and it is still crooked 3 weeks later. Pt has been keeping finger in a splint. Pt states finger is still painful and she is unable to bend tip of finger and it is numb.

## 2020-05-17 NOTE — ED Provider Notes (Signed)
[facility]    CSN: [id number] Arrival date & time: 05/16/20  1113      History   Chief Complaint Chief Complaint  Patient presents with  . Finger Injury    HPI Ariel Rodgers is a 34 y.o. female presenting today for evaluation of finger injury.  Patient reports approximately 3 weeks ago she was playing wrestling with her kids and husband, accidentally jammed her right index finger into her husband's head and has had pain and swelling around her DIP for the past 3 weeks.  She has been using an over-the-counter splint and buddy taping without relief of symptoms.  She reports continued difficulty bending at this joint as well as numbness from this joint and distally.  She has been taking some Tylenol and ibuprofen occasionally.  Reports that she cannot take narcotics, is on Suboxone.  HPI  Past Medical History:  Diagnosis Date  . Colitis   . Headache    rare    There are no problems to display for this patient.   Past Surgical History:  Procedure Laterality Date  . TUBAL LIGATION      OB History   No obstetric history on file.      Home Medications    Prior to Admission medications   Medication Sig Start Date End Date Taking? Authorizing Provider  ibuprofen (ADVIL) 800 MG tablet Take 1 tablet (800 mg total) by mouth 3 (three) times daily. 05/16/20   Lashanti Chambless C, PA-C  SUBOXONE 8-2 MG FILM PLACE 1 FILM UNDER TONGURE 2 TIMES A DAY 08/11/17   [provider]    Family History Family History  Family history unknown: Yes    Social History Social History   Tobacco Use  . Smoking status: Never Smoker  . Smokeless tobacco: Never Used  Vaping Use  . Vaping Use: Never used  Substance Use Topics  . Alcohol use: No  . Drug use: No     Allergies   Patient has no known allergies.   Review of Systems Review of Systems  Constitutional: Negative for fatigue and fever.  HENT: Negative for mouth sores.   Eyes: Negative for visual  disturbance.  Respiratory: Negative for shortness of breath.   Cardiovascular: Negative for chest pain.  Gastrointestinal: Negative for abdominal pain, nausea and vomiting.  Musculoskeletal: Positive for arthralgias. Negative for joint swelling.  Skin: Positive for color change. Negative for rash and wound.  Neurological: Negative for dizziness, weakness, light-headedness and headaches.     Physical Exam Triage Vital Signs ED Triage Vitals  Enc Vitals Group     BP 05/16/20 1323 123/71     Pulse Rate 05/16/20 1323 82     Resp 05/16/20 1323 14     Temp 05/16/20 1323 98.1 F (36.7 C)     Temp Source 05/16/20 1323 Oral     SpO2 05/16/20 1323 98 %     Weight --      Height --      Head Circumference --      Peak Flow --      Pain Score 05/16/20 1322 0     Pain Loc --      Pain Edu? --      Excl. in GC? --    No data found.  Updated Vital Signs BP 123/71 (BP Location: Right Arm)   Pulse 82   Temp 98.1 F (36.7 C) (Oral)   Resp 14   LMP 04/23/2020 (Approximate)   SpO2 98%  Visual Acuity Right Eye Distance:   Left Eye Distance:   Bilateral Distance:    Right Eye Near:   Left Eye Near:    Bilateral Near:     Physical Exam Vitals and nursing note reviewed.  Constitutional:      Appearance: She is well-developed.     Comments: No acute distress  HENT:     Head: Normocephalic and atraumatic.     Nose: Nose normal.  Eyes:     Conjunctiva/sclera: Conjunctivae normal.  Cardiovascular:     Rate and Rhythm: Normal rate.  Pulmonary:     Effort: Pulmonary effort is normal. No respiratory distress.  Abdominal:     General: There is no distension.  Musculoskeletal:        General: Normal range of motion.     Cervical back: Neck supple.     Comments: Right index finger with erythema and swelling about DIP, nontender to palpation throughout dorsum of right hand, proximal and middle phalanx of right index finger, tenderness to palpation about DIP.  Full active range of  motion at PIP  Skin:    General: Skin is warm and dry.  Neurological:     Mental Status: She is alert and oriented to person, place, and time.      UC Treatments / Results  Labs (all labs ordered are listed, but only abnormal results are displayed) Labs Reviewed - No data to display  EKG   Radiology DG Finger Index Right  Result Date: 05/16/2020 CLINICAL DATA:  Finger injury 3 weeks ago. Pain and swelling at the D IP joint. EXAM: RIGHT INDEX FINGER 2+V COMPARISON:  None. FINDINGS: There is an acute, mildly displaced intra-articular fracture involving the dorsal base of the distal phalanx, best seen on the lateral view. This demonstrates approximately 2 mm of posterior displacement. There is no dislocation. Soft tissue swelling is present at the DIP joint. IMPRESSION: Mildly displaced intra-articular fracture of the distal phalanx. This likely involves the extensor tendon insertion (mallet finger). Electronically Signed   By: Carey Bullocks M.D.   On: 05/16/2020 14:09    Procedures Procedures (including critical care time)  Medications Ordered in UC Medications - No data to display  Initial Impression / Assessment and Plan / UC Course  I have reviewed the triage vital signs and the nursing notes.  Pertinent labs & imaging results that were available during my care of the patient were reviewed by me and considered in my medical decision making (see chart for details).     X-ray with mildly displaced intra-articular fracture of distal phalanx.  Possible extensor tendon involvement.  Recommending to continue splint and follow-up with hand/orthopedics for further monitoring and management of fracture.  Tylenol and ibuprofen for pain.  Discussed strict return precautions. Patient verbalized understanding and is agreeable with plan.  Final Clinical Impressions(s) / UC Diagnoses   Final diagnoses:  Displaced fracture of distal phalanx of right index finger, initial encounter for  closed fracture     Discharge Instructions     Continue to wear splint Follow up with hand/ortho for further  treatment Use anti-inflammatories for pain/swelling. You may take up to 800 mg Ibuprofen every 8 hours with food. You may supplement Ibuprofen with Tylenol [phone] mg every 8 hours.  Ice   ED Prescriptions    Medication Sig Dispense Auth. Provider   ibuprofen (ADVIL) 800 MG tablet Take 1 tablet (800 mg total) by mouth 3 (three) times daily. 21 tablet Rozanne Heumann, [place] C, PA-C  PDMP not reviewed this encounter.   Lew Dawes, [place] 05/17/20 [phone]

## 2022-03-17 IMAGING — DX DG FINGER INDEX 2+V*R*
3 series · 3 of 3 positions shown · non-contrast
Comparison: None.

CLINICAL DATA: Finger injury 3 weeks ago. Pain and swelling at the
D IP joint.

EXAM:
RIGHT INDEX FINGER 2+V

[finger ap]
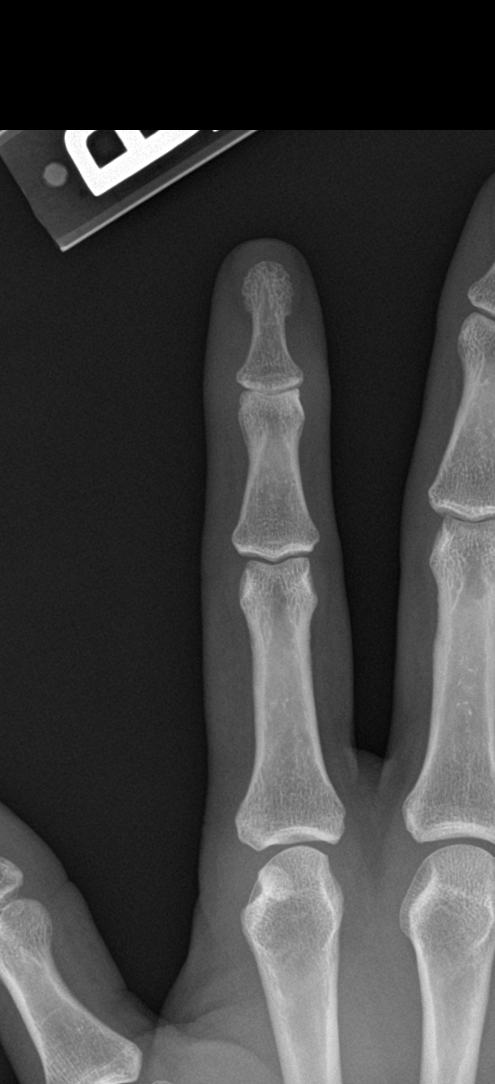

[finger obl]
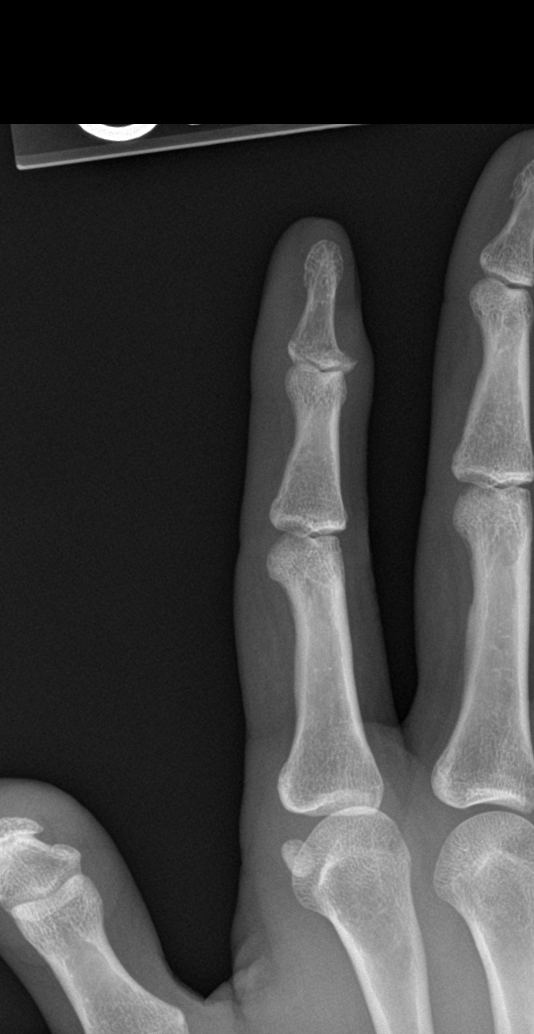

[finger lat]
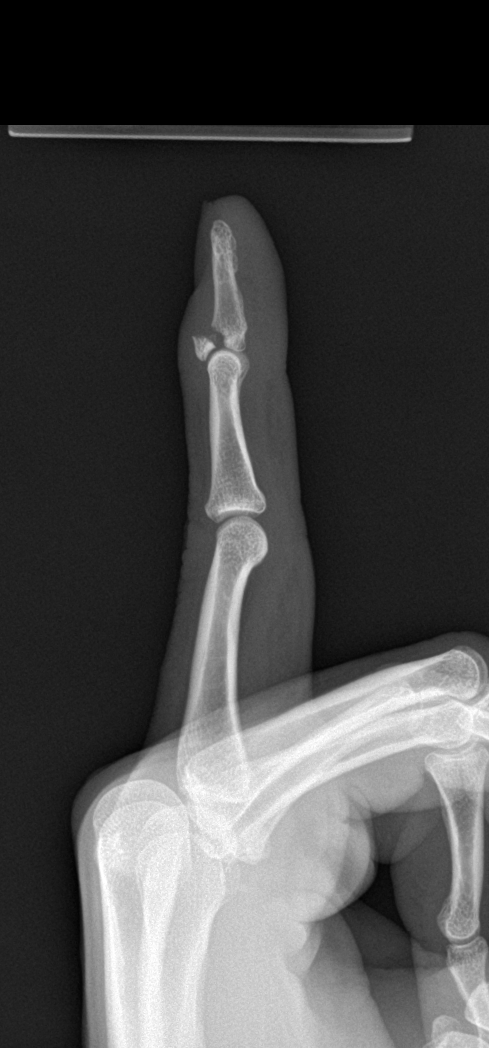

[3 of 3 positions shown; findings below may reference images not displayed]

FINDINGS: There is an acute, mildly displaced intra-articular fracture
involving the dorsal base of the distal phalanx, best seen on the
lateral view. This demonstrates approximately 2 mm of posterior
displacement. There is no dislocation. Soft tissue swelling is
present at the DIP joint.
IMPRESSION: Mildly displaced intra-articular fracture of the distal phalanx.
This likely involves the extensor tendon insertion (mallet finger).

## 2022-04-03 ENCOUNTER — Other Ambulatory Visit: Payer: Self-pay

## 2022-04-03 ENCOUNTER — Emergency Department
Admission: EM | Admit: 2022-04-03 | Discharge: 2022-04-03 | Disposition: A | Discharge status: Home / Self Care | Payer: Medicaid Other | Attending: Emergency Medicine | Admitting: Emergency Medicine

## 2022-04-03 DIAGNOSIS — G5602 Carpal tunnel syndrome, left upper limb: Secondary | ICD-10-CM | POA: Insufficient documentation

## 2022-04-03 DIAGNOSIS — R200 Anesthesia of skin: Secondary | ICD-10-CM | POA: Diagnosis present

## 2022-04-03 MED ORDER — IBUPROFEN 800 MG PO TABS: 800.0000 mg | ORAL_TABLET | Freq: Three times a day (TID) | ORAL | 0 refills | Status: AC | PRN

## 2022-04-03 MED ORDER — IBUPROFEN 800 MG PO TABS
800.0000 mg | ORAL_TABLET | Freq: Once | ORAL | Status: AC
  Administered 2022-04-03: 800 mg via ORAL
  Filled 2022-04-03: qty 1

## 2022-04-03 NOTE — Discharge Instructions (Addendum)
You may alternate Tylenol 1000 mg every 6 hours as needed for pain, fever and Ibuprofen 800 mg every 6-8 hours as needed for pain, fever.  Please take Ibuprofen with food.  Do not take more than 4000 mg of Tylenol (acetaminophen) in a 24 hour period. ° °

## 2022-04-03 NOTE — ED Notes (Signed)
Pt states numbness and tingling in lower forearm and left hand x 1 week.  States that now it comes and goes approx every 2 hours and there is numbness without the tingling.

## 2022-04-03 NOTE — ED Triage Notes (Signed)
Ambulatory to triage with c/o numbness to left hand x several days. Pt states is began as numbness and tingling but now " its so stiff I cant even move my index finger"

## 2022-04-03 NOTE — ED Provider Notes (Signed)
[facility] Provider Note    Event Date/Time   First MD Initiated Contact with Patient 04/03/22 [phone]     (approximate)   History   Hand Problem   HPI  Ariel Rodgers is a 36 y.o. female left-hand-dominant with no significant past medical history who presents to the emergency department with a week worth of intermittent numbness to the left hand.  She states she thinks it is in all 5 fingers.  It is worse with sleeping.  She states that will intermittently improve with changes in position.  No injury.  No redness or warmth.  No discoloration.  Patient states that she works at a [employer].  She has been trying heat without any relief.  She does not have a wrist brace.  She denies any known history of carpal tunnel.   History provided by patient.    Past Medical History:  Diagnosis Date   Colitis    Headache    rare    Past Surgical History:  Procedure Laterality Date   TUBAL LIGATION      MEDICATIONS:  Prior to Admission medications   Medication Sig Start Date End Date Taking? Authorizing Provider  ibuprofen (ADVIL) 800 MG tablet Take 1 tablet (800 mg total) by mouth 3 (three) times daily. 05/16/20   Wieters, Hallie C, PA-C  SUBOXONE 8-2 MG FILM PLACE 1 FILM UNDER TONGURE 2 TIMES A DAY 08/11/17   [provider]    Physical Exam   Triage Vital Signs: ED Triage Vitals  Enc Vitals Group     BP 04/03/22 0628 (!) 125/104     Pulse Rate 04/03/22 0628 76     Resp 04/03/22 0628 18     Temp 04/03/22 0628 98 F (36.7 C)     Temp Source 04/03/22 0628 Oral     SpO2 04/03/22 0628 97 %     Weight 04/03/22 0627 142 lb (64.4 kg)     Height 04/03/22 0627 5\' 4"  (1.626 m)     Head Circumference --      Peak Flow --      Pain Score 04/03/22 0627 7     Pain Loc --      Pain Edu? --      Excl. in GC? --     Most recent vital signs: Vitals:   04/03/22 0628  BP: (!) 125/104  Pulse: 76  Resp: 18  Temp: 98 F (36.7 C)  SpO2: 97%      CONSTITUTIONAL: Alert and responds appropriately to questions. Well-appearing; well-nourished HEAD: Normocephalic, atraumatic EYES: Conjunctivae clear, pupils appear equal ENT: normal nose; moist mucous membranes NECK: Normal range of motion CARD: Regular rate and rhythm RESP: Normal chest excursion without splinting or tachypnea; no hypoxia or respiratory distress, speaking full sentences ABD/GI: non-distended EXT: Normal ROM in all joints, no major deformities noted, no bony deformity or joint effusion noted.  No bony tenderness.  2+ left radial pulse and normal capillary refill.  No discoloration or cyanosis.  No tenderness over the left elbow or cubital fossa.  She has a positive Tinel's and Phalen's.  Compartments in the left arm soft.  Normal movement of the left arm, hand.  Normal grip. SKIN: Normal color for age and race, no rashes on exposed skin NEURO: Moves all extremities equally, normal speech, no facial asymmetry noted PSYCH: The patient's mood and manner are appropriate. Grooming and personal hygiene are appropriate.  ED Results / Procedures / Treatments   LABS: (  all labs ordered are listed, but only abnormal results are displayed) Labs Reviewed - No data to display   EKG:   RADIOLOGY: My personal review and interpretation of imaging:    I have personally reviewed all radiology reports. No results found.   PROCEDURES:  Critical Care performed: No     Procedures    IMPRESSION / MDM / ASSESSMENT AND PLAN / ED COURSE  I reviewed the triage vital signs and the nursing notes.   Patient here with symptoms of left-sided carpal tunnel syndrome.     DIFFERENTIAL DIAGNOSIS (includes but not limited to):   Carpal tunnel syndrome.  Doubt fracture, dislocation, cellulitis, septic arthritis, gout, compartment syndrome, cervical radiculopathy, spinal stenosis.  Doubt stroke.  Patient's presentation is most consistent with acute complicated illness / injury  requiring diagnostic workup.  PLAN: Patient here with symptoms of carpal tunnel syndrome.  No injury or signs or symptoms of fracture, dislocation on exam.  No signs of any infectious etiology.  Recommended rest, elevation, ice, wrist splint especially at night, ibuprofen.  Given outpatient orthopedic follow-up if symptoms not improving with symptomatic management.  Provided with work note.  Discussed with her if symptoms or not improving she may need steroid injections or carpal tunnel release surgery.   MEDICATIONS GIVEN IN ED: Medications  ibuprofen (ADVIL) tablet 800 mg (has no administration in time range)     ED COURSE:  At this time, I do not feel there is any life-threatening condition present. I reviewed all nursing notes, vitals, pertinent previous records.  All lab and urine results, EKGs, imaging ordered have been independently reviewed and interpreted by myself.  I reviewed all available radiology reports from any imaging ordered this visit.  Based on my assessment, I feel the patient is safe to be discharged home without further emergent workup and can continue workup as an outpatient as needed. Discussed all findings, treatment plan as well as usual and customary return precautions with patient.  They verbalize understanding and are comfortable with this plan.  Outpatient follow-up has been provided as needed.  All questions have been answered.    CONSULTS: No emergent consult needed at this time.  Patient has acute left-sided carpal tunnel syndrome.  No other focal neurologic deficits to suggest intracranial or spinal pathology.  No signs of any infectious etiology.   OUTSIDE RECORDS REVIEWED: Reviewed patient's previous gastroenterology note for colitis in 2018.     FINAL CLINICAL IMPRESSION(S) / ED DIAGNOSES   Final diagnoses:  Carpal tunnel syndrome on left     Rx / DC Orders   ED Discharge Orders          Ordered    ibuprofen (ADVIL) 800 MG tablet  Every 8 hours  PRN        04/03/22 0646             Note:  This document was prepared using Dragon voice recognition software and may include unintentional dictation errors.   Claretha Townshend, Layla Maw, DO 04/03/22 [phone]

## 2023-05-18 ENCOUNTER — Ambulatory Visit: Payer: Medicaid Other
